# Patient Record
Sex: Male | Born: 1990 | Hispanic: Yes | Marital: Single
Health system: Southern US, Community
[De-identification: ages and names within clinical notes are randomized; demographics above are authoritative.]

---

## 2014-04-29 ENCOUNTER — Encounter (HOSPITAL_COMMUNITY): Payer: Self-pay | Admitting: Emergency Medicine

## 2014-04-29 DIAGNOSIS — S301XXA Contusion of abdominal wall, initial encounter: Secondary | ICD-10-CM | POA: Insufficient documentation

## 2014-04-29 DIAGNOSIS — Y9389 Activity, other specified: Secondary | ICD-10-CM | POA: Diagnosis not present

## 2014-04-29 DIAGNOSIS — S20219A Contusion of unspecified front wall of thorax, initial encounter: Secondary | ICD-10-CM | POA: Diagnosis not present

## 2014-04-29 DIAGNOSIS — S298XXA Other specified injuries of thorax, initial encounter: Secondary | ICD-10-CM | POA: Diagnosis present

## 2014-04-29 DIAGNOSIS — Y92410 Unspecified street and highway as the place of occurrence of the external cause: Secondary | ICD-10-CM | POA: Insufficient documentation
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
## 2014-04-30 ENCOUNTER — Emergency Department (HOSPITAL_COMMUNITY)
Admission: EM | Admit: 2014-04-30 | Discharge: 2014-04-30 | Disposition: A | Discharge status: Home / Self Care | Payer: No Typology Code available for payment source | Attending: Emergency Medicine | Admitting: Emergency Medicine

## 2014-04-30 ENCOUNTER — Emergency Department (HOSPITAL_COMMUNITY): Payer: No Typology Code available for payment source

## 2014-04-30 DIAGNOSIS — T148XXA Other injury of unspecified body region, initial encounter: Secondary | ICD-10-CM

## 2014-04-30 MED ORDER — IBUPROFEN 600 MG PO TABS: 600.0000 mg | ORAL_TABLET | Freq: Four times a day (QID) | ORAL | Status: AC | PRN
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
## 2024-09-16 ENCOUNTER — Emergency Department (HOSPITAL_COMMUNITY)
Admission: EM | Admit: 2024-09-16 | Discharge: 2024-09-16 | Discharge status: Against Medical Advice | Payer: Self-pay | Attending: Emergency Medicine | Admitting: Emergency Medicine

## 2024-09-16 DIAGNOSIS — Z5321 Procedure and treatment not carried out due to patient leaving prior to being seen by health care provider: Secondary | ICD-10-CM | POA: Insufficient documentation

## 2024-09-16 DIAGNOSIS — J029 Acute pharyngitis, unspecified: Secondary | ICD-10-CM | POA: Insufficient documentation

## 2024-09-16 DIAGNOSIS — M7918 Myalgia, other site: Secondary | ICD-10-CM | POA: Insufficient documentation

## 2024-09-16 NOTE — ED Notes (Signed)
 Patient stated they were leaving and handed registration their name stickers and bp cuff
